# Patient Record
Sex: Female | Born: 1990 | Hispanic: No
Health system: Southern US, Community
[De-identification: ages and names within clinical notes are randomized; demographics above are authoritative.]

## PROBLEM LIST (undated history)

## (undated) DIAGNOSIS — R569 Unspecified convulsions: Secondary | ICD-10-CM

---

## 2016-02-01 DIAGNOSIS — Z01419 Encounter for gynecological examination (general) (routine) without abnormal findings: Secondary | ICD-10-CM | POA: Diagnosis not present

## 2016-02-01 DIAGNOSIS — Z113 Encounter for screening for infections with a predominantly sexual mode of transmission: Secondary | ICD-10-CM | POA: Diagnosis not present

## 2016-02-01 DIAGNOSIS — Z124 Encounter for screening for malignant neoplasm of cervix: Secondary | ICD-10-CM | POA: Diagnosis not present

## 2016-02-01 DIAGNOSIS — Z130 Encounter for screening for diseases of the blood and blood-forming organs and certain disorders involving the immune mechanism: Secondary | ICD-10-CM | POA: Diagnosis not present

## 2016-04-19 DIAGNOSIS — T7840XA Allergy, unspecified, initial encounter: Secondary | ICD-10-CM | POA: Diagnosis not present

## 2017-01-08 DIAGNOSIS — L810 Postinflammatory hyperpigmentation: Secondary | ICD-10-CM | POA: Diagnosis not present

## 2017-01-08 DIAGNOSIS — Z79899 Other long term (current) drug therapy: Secondary | ICD-10-CM | POA: Diagnosis not present

## 2017-01-08 DIAGNOSIS — L700 Acne vulgaris: Secondary | ICD-10-CM | POA: Diagnosis not present

## 2017-02-01 DIAGNOSIS — Z130 Encounter for screening for diseases of the blood and blood-forming organs and certain disorders involving the immune mechanism: Secondary | ICD-10-CM | POA: Diagnosis not present

## 2017-02-01 DIAGNOSIS — Z124 Encounter for screening for malignant neoplasm of cervix: Secondary | ICD-10-CM | POA: Diagnosis not present

## 2017-02-01 DIAGNOSIS — Z01419 Encounter for gynecological examination (general) (routine) without abnormal findings: Secondary | ICD-10-CM | POA: Diagnosis not present

## 2017-02-07 DIAGNOSIS — L700 Acne vulgaris: Secondary | ICD-10-CM | POA: Diagnosis not present

## 2017-02-07 DIAGNOSIS — Z79899 Other long term (current) drug therapy: Secondary | ICD-10-CM | POA: Diagnosis not present

## 2017-02-07 DIAGNOSIS — L810 Postinflammatory hyperpigmentation: Secondary | ICD-10-CM | POA: Diagnosis not present

## 2017-03-11 DIAGNOSIS — Z79899 Other long term (current) drug therapy: Secondary | ICD-10-CM | POA: Diagnosis not present

## 2017-03-11 DIAGNOSIS — L700 Acne vulgaris: Secondary | ICD-10-CM | POA: Diagnosis not present

## 2017-03-11 DIAGNOSIS — L810 Postinflammatory hyperpigmentation: Secondary | ICD-10-CM | POA: Diagnosis not present

## 2017-03-19 DIAGNOSIS — L700 Acne vulgaris: Secondary | ICD-10-CM | POA: Diagnosis not present

## 2017-04-11 DIAGNOSIS — L700 Acne vulgaris: Secondary | ICD-10-CM | POA: Diagnosis not present

## 2017-04-11 DIAGNOSIS — L2084 Intrinsic (allergic) eczema: Secondary | ICD-10-CM | POA: Diagnosis not present

## 2017-04-11 DIAGNOSIS — Z79899 Other long term (current) drug therapy: Secondary | ICD-10-CM | POA: Diagnosis not present

## 2017-04-11 DIAGNOSIS — L810 Postinflammatory hyperpigmentation: Secondary | ICD-10-CM | POA: Diagnosis not present

## 2017-05-13 DIAGNOSIS — Z79899 Other long term (current) drug therapy: Secondary | ICD-10-CM | POA: Diagnosis not present

## 2017-05-13 DIAGNOSIS — L700 Acne vulgaris: Secondary | ICD-10-CM | POA: Diagnosis not present

## 2017-05-13 DIAGNOSIS — L810 Postinflammatory hyperpigmentation: Secondary | ICD-10-CM | POA: Diagnosis not present

## 2017-06-13 DIAGNOSIS — L810 Postinflammatory hyperpigmentation: Secondary | ICD-10-CM | POA: Diagnosis not present

## 2017-06-13 DIAGNOSIS — L700 Acne vulgaris: Secondary | ICD-10-CM | POA: Diagnosis not present

## 2017-06-13 DIAGNOSIS — Z79899 Other long term (current) drug therapy: Secondary | ICD-10-CM | POA: Diagnosis not present

## 2017-07-16 DIAGNOSIS — L700 Acne vulgaris: Secondary | ICD-10-CM | POA: Diagnosis not present

## 2017-07-16 DIAGNOSIS — L810 Postinflammatory hyperpigmentation: Secondary | ICD-10-CM | POA: Diagnosis not present

## 2017-07-16 DIAGNOSIS — Z79899 Other long term (current) drug therapy: Secondary | ICD-10-CM | POA: Diagnosis not present

## 2017-08-22 DIAGNOSIS — Z79899 Other long term (current) drug therapy: Secondary | ICD-10-CM | POA: Diagnosis not present

## 2017-09-11 DIAGNOSIS — R413 Other amnesia: Secondary | ICD-10-CM | POA: Diagnosis not present

## 2017-09-11 DIAGNOSIS — R6889 Other general symptoms and signs: Secondary | ICD-10-CM | POA: Diagnosis not present

## 2017-10-03 DIAGNOSIS — R569 Unspecified convulsions: Secondary | ICD-10-CM | POA: Diagnosis not present

## 2017-10-08 DIAGNOSIS — R410 Disorientation, unspecified: Secondary | ICD-10-CM | POA: Diagnosis not present

## 2017-10-14 DIAGNOSIS — G40909 Epilepsy, unspecified, not intractable, without status epilepticus: Secondary | ICD-10-CM | POA: Diagnosis not present

## 2017-10-14 DIAGNOSIS — R6889 Other general symptoms and signs: Secondary | ICD-10-CM | POA: Diagnosis not present

## 2017-11-03 ENCOUNTER — Emergency Department (HOSPITAL_COMMUNITY)
Admission: EM | Admit: 2017-11-03 | Discharge: 2017-11-04 | Disposition: A | Discharge status: Home / Self Care | Payer: BLUE CROSS/BLUE SHIELD | Attending: Emergency Medicine | Admitting: Emergency Medicine

## 2017-11-03 ENCOUNTER — Emergency Department (HOSPITAL_COMMUNITY): Payer: BLUE CROSS/BLUE SHIELD

## 2017-11-03 ENCOUNTER — Encounter (HOSPITAL_COMMUNITY): Payer: Self-pay

## 2017-11-03 ENCOUNTER — Other Ambulatory Visit: Payer: Self-pay

## 2017-11-03 DIAGNOSIS — R4020 Unspecified coma: Secondary | ICD-10-CM | POA: Diagnosis not present

## 2017-11-03 DIAGNOSIS — M79605 Pain in left leg: Secondary | ICD-10-CM | POA: Insufficient documentation

## 2017-11-03 DIAGNOSIS — R51 Headache: Secondary | ICD-10-CM | POA: Diagnosis not present

## 2017-11-03 DIAGNOSIS — R102 Pelvic and perineal pain: Secondary | ICD-10-CM | POA: Diagnosis not present

## 2017-11-03 DIAGNOSIS — S0993XA Unspecified injury of face, initial encounter: Secondary | ICD-10-CM | POA: Diagnosis not present

## 2017-11-03 DIAGNOSIS — Z23 Encounter for immunization: Secondary | ICD-10-CM | POA: Diagnosis not present

## 2017-11-03 DIAGNOSIS — R0789 Other chest pain: Secondary | ICD-10-CM | POA: Diagnosis not present

## 2017-11-03 DIAGNOSIS — Z79899 Other long term (current) drug therapy: Secondary | ICD-10-CM | POA: Diagnosis not present

## 2017-11-03 DIAGNOSIS — S0990XA Unspecified injury of head, initial encounter: Secondary | ICD-10-CM | POA: Diagnosis not present

## 2017-11-03 DIAGNOSIS — M25572 Pain in left ankle and joints of left foot: Secondary | ICD-10-CM | POA: Diagnosis not present

## 2017-11-03 HISTORY — DX: Unspecified convulsions: R56.9

## 2017-11-03 LAB — URINALYSIS, ROUTINE W REFLEX MICROSCOPIC
Bilirubin Urine: NEGATIVE
Glucose, UA: NEGATIVE mg/dL
Ketones, ur: NEGATIVE mg/dL
Leukocytes, UA: NEGATIVE
Nitrite: NEGATIVE
Protein, ur: NEGATIVE mg/dL
Specific Gravity, Urine: 1.011 (ref 1.005–1.030)
pH: 6 (ref 5.0–8.0)

## 2017-11-03 LAB — CBC WITH DIFFERENTIAL/PLATELET
Basophils Absolute: 0.1 10*3/uL (ref 0.0–0.1)
Basophils Relative: 1 %
Eosinophils Absolute: 0.2 10*3/uL (ref 0.0–0.7)
Eosinophils Relative: 2 %
HCT: 39.9 % (ref 36.0–46.0)
Hemoglobin: 13.5 g/dL (ref 12.0–15.0)
Lymphocytes Relative: 20 %
Lymphs Abs: 2.1 10*3/uL (ref 0.7–4.0)
MCH: 31.3 pg (ref 26.0–34.0)
MCHC: 33.8 g/dL (ref 30.0–36.0)
MCV: 92.4 fL (ref 78.0–100.0)
Monocytes Absolute: 1 10*3/uL (ref 0.1–1.0)
Monocytes Relative: 10 %
Neutro Abs: 7 10*3/uL (ref 1.7–7.7)
Neutrophils Relative %: 67 %
Platelets: 338 10*3/uL (ref 150–400)
RBC: 4.32 MIL/uL (ref 3.87–5.11)
RDW: 12.3 % (ref 11.5–15.5)
WBC: 10.4 10*3/uL (ref 4.0–10.5)

## 2017-11-03 LAB — COMPREHENSIVE METABOLIC PANEL WITH GFR
ALT: 14 U/L (ref 14–54)
AST: 32 U/L (ref 15–41)
Albumin: 3.9 g/dL (ref 3.5–5.0)
Alkaline Phosphatase: 42 U/L (ref 38–126)
Anion gap: 8 (ref 5–15)
BUN: 9 mg/dL (ref 6–20)
CO2: 22 mmol/L (ref 22–32)
Calcium: 9 mg/dL (ref 8.9–10.3)
Chloride: 106 mmol/L (ref 101–111)
Creatinine, Ser: 0.74 mg/dL (ref 0.44–1.00)
GFR calc Af Amer: >60 mL/min (ref >60)
GFR calc non Af Amer: >60 mL/min (ref >60)
Glucose, Bld: 79 mg/dL (ref 65–99)
Potassium: 3.8 mmol/L (ref 3.5–5.1)
Sodium: 136 mmol/L (ref 135–145)
Total Bilirubin: 0.7 mg/dL (ref 0.3–1.2)
Total Protein: 6.9 g/dL (ref 6.5–8.1)

## 2017-11-03 LAB — I-STAT BETA HCG BLOOD, ED (MC, WL, AP ONLY): I-stat hCG, quantitative: <5 m[IU]/mL (ref <5)

## 2017-11-03 MED ORDER — FENTANYL CITRATE (PF) 100 MCG/2ML IJ SOLN
50.0000 ug | Freq: Once | INTRAMUSCULAR | Status: AC
  Administered 2017-11-03: 25 ug via INTRAVENOUS
  Filled 2017-11-03: qty 2

## 2017-11-03 MED ORDER — IBUPROFEN 400 MG PO TABS
400.0000 mg | ORAL_TABLET | Freq: Once | ORAL | Status: AC
  Administered 2017-11-03: 400 mg via ORAL
  Filled 2017-11-03: qty 1

## 2017-11-03 MED ORDER — OXYCODONE-ACETAMINOPHEN 5-325 MG PO TABS
1.0000 | ORAL_TABLET | Freq: Once | ORAL | Status: AC
  Administered 2017-11-03: 1 via ORAL
  Filled 2017-11-03: qty 1

## 2017-11-03 MED ORDER — TETANUS-DIPHTH-ACELL PERTUSSIS 5-2.5-18.5 LF-MCG/0.5 IM SUSP
0.5000 mL | Freq: Once | INTRAMUSCULAR | Status: AC
  Administered 2017-11-03: 0.5 mL via INTRAMUSCULAR
  Filled 2017-11-03: qty 0.5

## 2017-11-03 MED ORDER — SODIUM CHLORIDE 0.9 % IV SOLN
1000.0000 mg | Freq: Once | INTRAVENOUS | Status: AC
  Administered 2017-11-03: 1000 mg via INTRAVENOUS
  Filled 2017-11-03: qty 10

## 2017-11-03 NOTE — ED Notes (Signed)
Patient transported to CT 

## 2017-11-03 NOTE — ED Notes (Signed)
Patient transported to X-ray 

## 2017-11-03 NOTE — ED Notes (Signed)
Pt returned from MRI °
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
## 2017-11-03 NOTE — ED Notes (Signed)
Patient transported to MRI 
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
## 2017-11-04 MED ORDER — LEVETIRACETAM 500 MG PO TABS: 500.0000 mg | ORAL_TABLET | Freq: Two times a day (BID) | ORAL | 0 refills | Status: AC

## 2017-11-04 MED ORDER — IBUPROFEN 400 MG PO TABS: 400.0000 mg | ORAL_TABLET | Freq: Four times a day (QID) | ORAL | 0 refills | Status: AC | PRN

## 2017-11-04 NOTE — Discharge Instructions (Signed)
Please follow-up with your neurologist for further evaluation.  Please do not drive until you are cleared by your neurologist or another physician.

## 2017-11-06 DIAGNOSIS — G40909 Epilepsy, unspecified, not intractable, without status epilepticus: Secondary | ICD-10-CM | POA: Diagnosis not present
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
